# Patient Record
Sex: Female | Born: 2000
Health system: Southern US, Community
[De-identification: ages and names within clinical notes are randomized; demographics above are authoritative.]

## PROBLEM LIST (undated history)

## (undated) DIAGNOSIS — N920 Excessive and frequent menstruation with regular cycle: Secondary | ICD-10-CM

## (undated) DIAGNOSIS — N83209 Unspecified ovarian cyst, unspecified side: Secondary | ICD-10-CM

## (undated) HISTORY — PX: TONSILLECTOMY: SUR1361

## (undated) HISTORY — DX: Unspecified ovarian cyst, unspecified side: N83.209

## (undated) HISTORY — DX: Excessive and frequent menstruation with regular cycle: N92.0

---
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
## 2021-05-11 ENCOUNTER — Other Ambulatory Visit (INDEPENDENT_AMBULATORY_CARE_PROVIDER_SITE_OTHER): Payer: 59 | Admitting: Family Medicine

## 2021-05-11 ENCOUNTER — Other Ambulatory Visit: Payer: Self-pay

## 2021-05-11 ENCOUNTER — Encounter: Payer: Self-pay | Admitting: Family Medicine

## 2021-05-11 ENCOUNTER — Other Ambulatory Visit: Payer: 59

## 2021-05-11 ENCOUNTER — Telehealth (INDEPENDENT_AMBULATORY_CARE_PROVIDER_SITE_OTHER): Payer: 59 | Admitting: Family Medicine

## 2021-05-11 DIAGNOSIS — J069 Acute upper respiratory infection, unspecified: Secondary | ICD-10-CM | POA: Diagnosis not present

## 2021-05-11 DIAGNOSIS — J029 Acute pharyngitis, unspecified: Secondary | ICD-10-CM | POA: Diagnosis not present

## 2021-05-11 DIAGNOSIS — R0981 Nasal congestion: Secondary | ICD-10-CM

## 2021-05-11 LAB — POCT RAPID STREP A (OFFICE): Rapid Strep A Screen: NEGATIVE

## 2021-05-11 NOTE — Assessment & Plan Note (Signed)
With recent COVID exposure, anticipate COVID related symptoms. I have asked her to come in this afternoon for PCR swab. She does work at [employer]. With ST and h/o strep, check RST.  Supportive care measures reviewed including fluids, rest, mucinex, vit C, vit D, zinc, etc.  Reviewed isolation guidelines if COVID swab returns positive.  Update if not improving with treatment.

## 2021-05-11 NOTE — Telephone Encounter (Signed)
Called patient set up as virtual with you today.

## 2021-05-11 NOTE — Progress Notes (Signed)
Patient ID: Lindsey Schmidt, female    DOB: Feb 17, 2001, 20 y.o.   MRN: [id number]  Virtual visit completed through [employer], a video enabled telemedicine application. Due to national recommendations of social distancing due to COVID-19, a virtual visit is felt to be most appropriate for this patient at this time. Reviewed limitations, risks, security and privacy concerns of performing a virtual visit and the availability of in person appointments. I also reviewed that there may be a patient responsible charge related to this service. The patient agreed to proceed.   Patient location: home Provider location: [place] [facility], office Persons participating in this virtual visit: patient, provider   If any vitals were documented, they were collected by patient at home unless specified below.    Temp 98.8 F (37.1 C)   Ht '5\' 8"'$  (1.727 m)   Wt 192 lb 7 oz (87.3 kg)   LMP 03/30/2021 (Approximate) Comment: Recently started new birth conrtol  BMI 29.26 kg/m    CC: ST Subjective:   HPI: Lindsey Schmidt is a 20 y.o. female presenting on 05/11/2021 for Sore Throat (C/o ST, nasal congestion, HA and earache.  Sxs started 05/10/21.  Has had 2 Pfizer doses, last was 03/18/20.)   1d h/o ST, head congestion, sinus pressure, headache and L earache. Minimal cough. Some upper back soreness.   No fevers/chills, tooth pain, abd pain, nausea, diarrhea. No dyspnea or wheezing.   No h/o asthma.  No smoking.  H/o strep throat - but this has improved since tonsils removed.   She took 2 rapid COVID tests at home - both negative.  2 coworkers tested positive for [place] recently.   Treated with allegra.  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  Has had covid vaccine x2, booster x1.     Relevant past medical, surgical, family and social history reviewed and updated as indicated. Interim medical history since our last visit reviewed. Allergies and medications reviewed and updated. Outpatient Medications  Prior to Visit  Medication Sig Dispense Refill   etonogestrel-ethinyl estradiol (NUVARING) 0.12-0.015 MG/24HR vaginal ring SMARTSIG:1 Ring Vaginal Once a Month     No facility-administered medications prior to visit.     Per HPI unless specifically indicated in ROS section below Review of Systems Objective:  Temp 98.8 F (37.1 C)   Ht '5\' 8"'$  (1.727 m)   Wt 192 lb 7 oz (87.3 kg)   LMP 03/30/2021 (Approximate) Comment: Recently started new birth conrtol  BMI 29.26 kg/m   Wt Readings from Last 3 Encounters:  05/11/21 192 lb 7 oz (87.3 kg) (97 %, Z= 1.83)*  07/31/19 177 lb 8 oz (80.5 kg) (95 %, Z= 1.66)*  11/17/18 158 lb 8 oz (71.9 kg) (90 %, Z= 1.30)*   * Growth percentiles are based on CDC (Girls, 2-20 Years) data.       Physical exam: Gen: alert, NAD, not ill appearing Pulm: speaks in complete sentences without increased work of breathing Psych: normal mood, normal thought content      Assessment & Plan:   Problem List Items Addressed This Visit     Acute upper respiratory infection    With recent COVID exposure, anticipate COVID related symptoms. I have asked her to come in this afternoon for PCR swab. She does work at [employer]. With ST and h/o strep, check RST.  Supportive care measures reviewed including fluids, rest, mucinex, vit C, vit D, zinc, etc.  Reviewed isolation guidelines if COVID swab returns positive.  Update if not improving with treatment.         No orders of the defined types were placed in this encounter.  No orders of the defined types were placed in this encounter.   I discussed the assessment and treatment plan with the patient. The patient was provided an opportunity to ask questions and all were answered. The patient agreed with the plan and demonstrated an understanding of the instructions. The patient was advised to call back or seek an in-person evaluation if the symptoms worsen or if the condition fails to improve as anticipated.  Follow up  plan: No follow-ups on file.  Ria Bush, MD

## 2021-05-12 LAB — SARS-COV-2, NAA 2 DAY TAT

## 2021-05-12 LAB — NOVEL CORONAVIRUS, NAA: SARS-CoV-2, NAA: NOT DETECTED

## 2021-05-13 ENCOUNTER — Encounter: Payer: Self-pay | Admitting: Family Medicine

## 2022-01-08 ENCOUNTER — Other Ambulatory Visit: Payer: Self-pay

## 2022-01-08 ENCOUNTER — Ambulatory Visit (INDEPENDENT_AMBULATORY_CARE_PROVIDER_SITE_OTHER): Payer: 59 | Admitting: Primary Care

## 2022-01-08 ENCOUNTER — Encounter: Payer: Self-pay | Admitting: Primary Care

## 2022-01-08 VITALS — BP 118/78 | HR 110 | Temp 97.9°F | Ht 68.0 in | Wt 199.6 lb

## 2022-01-08 DIAGNOSIS — J302 Other seasonal allergic rhinitis: Secondary | ICD-10-CM

## 2022-01-08 DIAGNOSIS — Z23 Encounter for immunization: Secondary | ICD-10-CM | POA: Diagnosis not present

## 2022-01-08 DIAGNOSIS — Z0000 Encounter for general adult medical examination without abnormal findings: Secondary | ICD-10-CM

## 2022-01-08 DIAGNOSIS — N920 Excessive and frequent menstruation with regular cycle: Secondary | ICD-10-CM

## 2022-01-08 DIAGNOSIS — Z0184 Encounter for antibody response examination: Secondary | ICD-10-CM

## 2022-01-08 DIAGNOSIS — Z111 Encounter for screening for respiratory tuberculosis: Secondary | ICD-10-CM

## 2022-01-08 NOTE — Addendum Note (Signed)
Addended by: Tyrone Apple on: 01/08/2022 04:01 PM ? ? Modules accepted: Orders ? ?

## 2022-01-08 NOTE — Assessment & Plan Note (Signed)
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 ?Other vaccines up-to-date. ? ?Discussed the importance of a healthy diet and regular exercise in order for weight loss, and to reduce the risk of further co-morbidity. ? ?Exam today stable. ?Labs pending. ?

## 2022-01-08 NOTE — Assessment & Plan Note (Signed)
Stable. ? ?Continue to monitor.  ?Not on treatment.  ?

## 2022-01-08 NOTE — Assessment & Plan Note (Signed)
Controlled. ? ?Follows with GYN. ?Continue Nuva Ring. ?

## 2022-01-08 NOTE — Patient Instructions (Signed)
Stop by the lab prior to leaving today. I will notify you of your results once received.  ? ?You may pick up your forms on Monday morning.  ? ?It was a pleasure to see you today! ? ?Preventive Care 4-20 Years Old, Female ?Preventive care refers to lifestyle choices and visits with your health care provider that can promote health and wellness. Preventive care visits are also called wellness exams. ?What can I expect for my preventive care visit? ?Counseling ?During your preventive care visit, your health care provider may ask about your: ?Medical history, including: ?Past medical problems. ?Family medical history. ?Pregnancy history. ?Current health, including: ?Menstrual cycle. ?Method of birth control. ?Emotional well-being. ?Home life and relationship well-being. ?Sexual activity and sexual health. ?Lifestyle, including: ?Alcohol, nicotine or tobacco, and drug use. ?Access to firearms. ?Diet, exercise, and sleep habits. ?Work and work Statistician. ?Sunscreen use. ?Safety issues such as seatbelt and bike helmet use. ?Physical exam ?Your health care provider may check your: ?Height and weight. These may be used to calculate your BMI (body mass index). BMI is a measurement that tells if you are at a healthy weight. ?Waist circumference. This measures the distance around your waistline. This measurement also tells if you are at a healthy weight and may help predict your risk of certain diseases, such as type 2 diabetes and high blood pressure. ?Heart rate and blood pressure. ?Body temperature. ?Skin for abnormal spots. ?What immunizations do I need? ?Vaccines are usually given at various ages, according to a schedule. Your health care provider will recommend vaccines for you based on your age, medical history, and lifestyle or other factors, such as travel or where you work. ?What tests do I need? ?Screening ?Your health care provider may recommend screening tests for certain conditions. This may include: ?Pelvic  exam and Pap test. ?Lipid and cholesterol levels. ?Diabetes screening. This is done by checking your blood sugar (glucose) after you have not eaten for a while (fasting). ?Hepatitis B test. ?Hepatitis C test. ?HIV (human immunodeficiency virus) test. ?STI (sexually transmitted infection) testing, if you are at risk. ?BRCA-related cancer screening. This may be done if you have a family history of breast, ovarian, tubal, or peritoneal cancers. ?Talk with your health care provider about your test results, treatment options, and if necessary, the need for more tests. ?Follow these instructions at home: ?Eating and drinking ? ?Eat a healthy diet that includes fresh fruits and vegetables, whole grains, lean protein, and low-fat dairy products. ?Take vitamin and mineral supplements as recommended by your health care provider. ?Do not drink alcohol if: ?Your health care provider tells you not to drink. ?You are pregnant, may be pregnant, or are planning to become pregnant. ?If you drink alcohol: ?Limit how much you have to 0-1 drink a day. ?Know how much alcohol is in your drink. In the [place] one drink equals one 12 oz bottle of beer (355 mL), one 5 oz glass of wine (148 mL), or one 1? oz glass of hard liquor (44 mL). ?Lifestyle ?Brush your teeth every morning and night with fluoride toothpaste. Floss one time each day. ?Exercise for at least 30 minutes 5 or more days each week. ?Do not use any products that contain nicotine or tobacco. These products include cigarettes, chewing tobacco, and vaping devices, such as e-cigarettes. If you need help quitting, ask your health care provider. ?Do not use drugs. ?If you are sexually active, practice safe sex. Use a condom or other form of protection to prevent STIs. ?  If you do not wish to become pregnant, use a form of birth control. If you plan to become pregnant, see your health care provider for a prepregnancy visit. ?Find healthy ways to manage stress, such as: ?Meditation,  yoga, or listening to music. ?Journaling. ?Talking to a trusted person. ?Spending time with friends and family. ?Minimize exposure to UV radiation to reduce your risk of skin cancer. ?Safety ?Always wear your seat belt while driving or riding in a vehicle. ?Do not drive: ?If you have been drinking alcohol. Do not ride with someone who has been drinking. ?If you have been using any mind-altering substances or drugs. ?While texting. ?When you are tired or distracted. ?Wear a helmet and other protective equipment during sports activities. ?If you have firearms in your house, make sure you follow all gun safety procedures. ?Seek help if you have been physically or sexually abused. ?What's next? ?Go to your health care provider once a year for an annual wellness visit. ?Ask your health care provider how often you should have your eyes and teeth checked. ?Stay up to date on all vaccines. ?This information is not intended to replace advice given to you by your health care provider. Make sure you discuss any questions you have with your health care provider. ?Document Revised: 04/01/2021 Document Reviewed: 04/01/2021 ?Elsevier Patient Education ? [place]. ? ?

## 2022-01-08 NOTE — Progress Notes (Signed)
? ?Subjective:  ? ? Patient ID: Lindsey Schmidt, female    DOB: 2001/04/13, 21 y.o.   MRN: [id number] ? ?HPI ? ?Lindsey Schmidt is a very pleasant 21 y.o. female who presents today for complete physical and follow up of chronic conditions. ? ?Immunizations: ?-Tetanus: 2014, needs for school.  ?-Influenza: Completed this season  ?-Covid-19: 3 vaccines ? ?-HPV: Completed series ? ?Diet: Fair diet.  ?Exercise: Exercising at the gym twice weekly  ? ?Eye exam: Completed years ago ?Dental exam: Completes semi-annually  ? ?BP Readings from Last 3 Encounters:  ?01/08/22 118/78  ?10/24/20 128/82  ?07/31/19 122/70 (84 %, Z = 0.99 /  66 %, Z = 0.41)*  ? ?*BP percentiles are based on the 2017 AAP Clinical Practice Guideline for girls  ? ? ? ? ? ? ?Review of Systems  ?Constitutional:  Negative for unexpected weight change.  ?HENT:  Negative for rhinorrhea.   ?Eyes:  Negative for visual disturbance.  ?Respiratory:  Negative for cough and shortness of breath.   ?Cardiovascular:  Negative for chest pain.  ?Gastrointestinal:  Negative for constipation and diarrhea.  ?Genitourinary:  Negative for difficulty urinating and menstrual problem.  ?Musculoskeletal:  Negative for arthralgias and myalgias.  ?Skin:  Negative for rash.  ?Allergic/Immunologic: Negative for environmental allergies.  ?Neurological:  Negative for dizziness and headaches.  ?Psychiatric/Behavioral:  The patient is not nervous/anxious.   ? ?   ? ? ?Past Medical History:  ?Diagnosis Date  ? Menorrhagia   ? Ovarian cyst   ? x2  ? ? ?Social History  ? ?Socioeconomic History  ? Marital status: Single  ?  Spouse name: Not on file  ? Number of children: Not on file  ? Years of education: Not on file  ? Highest education level: Not on file  ?Occupational History  ? Not on file  ?Tobacco Use  ? Smoking status: Never  ? Smokeless tobacco: Never  ?Substance and Sexual Activity  ? Alcohol use: No  ? Drug use: Not on file  ? Sexual activity: Not on file  ?Other Topics Concern  ?  Not on file  ?Social History Narrative  ? 9th grade.  ? Enjoys History.  ? 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  ? Enjoys playing golf, softball, plays in the marching band.  ? ?Social Determinants of Health  ? ?Financial Resource Strain: Not on file  ?Food Insecurity: Not on file  ?Transportation Needs: Not on file  ?Physical Activity: Not on file  ?Stress: Not on file  ?Social Connections: Not on file  ?Intimate Partner Violence: Not on file  ? ? ?Past Surgical History:  ?Procedure Laterality Date  ? TONSILLECTOMY    ? ? ?No family history on file. ? ?No Known Allergies ? ?Current Outpatient Medications on File Prior to Visit  ?Medication Sig Dispense Refill  ? etonogestrel-ethinyl estradiol (NUVARING) 0.12-0.015 MG/24HR vaginal ring SMARTSIG:1 Ring Vaginal Once a Month    ? ?No current facility-administered medications on file prior to visit.  ? ? ?BP 118/78   Pulse (!) 110   Ht '5\' 8"'$  (1.727 m)   Wt 199 lb 9.6 oz (90.5 kg)   SpO2 96%   BMI 30.35 kg/m?  ?Objective:  ? Physical Exam ?HENT:  ?   Right Ear: Tympanic membrane and ear canal normal.  ?   Left Ear: Tympanic membrane and ear canal normal.  ?   Nose: Nose normal.  ?Eyes:  ?   Conjunctiva/sclera: Conjunctivae normal.  ?  Pupils: Pupils are equal, round, and reactive to light.  ?Neck:  ?   Thyroid: No thyromegaly.  ?Cardiovascular:  ?   Rate and Rhythm: Normal rate and regular rhythm.  ?   Heart sounds: No murmur heard. ?Pulmonary:  ?   Effort: Pulmonary effort is normal.  ?   Breath sounds: Normal breath sounds. No rales.  ?Abdominal:  ?   General: Bowel sounds are normal.  ?   Palpations: Abdomen is soft.  ?   Tenderness: There is no abdominal tenderness.  ?Musculoskeletal:     ?   General: Normal range of motion.  ?   Cervical back: Neck supple.  ?Lymphadenopathy:  ?   Cervical: No cervical adenopathy.  ?Skin: ?   General: Skin is warm and dry.  ?   Findings: No rash.  ?Neurological:  ?   Mental Status: She is alert and oriented to person, place, and time.  ?    Cranial Nerves: No cranial nerve deficit.  ?   Deep Tendon Reflexes: Reflexes are normal and symmetric.  ?Psychiatric:     ?   Mood and Affect: Mood normal.  ? ? ? ? ? ?   ?Assessment & Plan:  ? ? ? ? ?This visit occurred during the SARS-CoV-2 public health emergency.  Safety protocols were in place, including screening questions prior to the visit, additional usage of staff PPE, and extensive cleaning of exam room while observing appropriate contact time as indicated for disinfecting solutions.  ?

## 2022-01-11 LAB — COMPREHENSIVE METABOLIC PANEL
AG Ratio: 1.6 (calc) (ref 1.0–2.5)
ALT: 12 U/L (ref 6–29)
AST: 12 U/L (ref 10–30)
Albumin: 4.3 g/dL (ref 3.6–5.1)
Alkaline phosphatase (APISO): 47 U/L (ref 31–125)
BUN: 16 mg/dL (ref 7–25)
CO2: 21 mmol/L (ref 20–32)
Calcium: 9.6 mg/dL (ref 8.6–10.2)
Chloride: 106 mmol/L (ref 98–110)
Creat: 0.83 mg/dL (ref 0.50–0.96)
Globulin: 2.7 g/dL (calc) (ref 1.9–3.7)
Glucose, Bld: 89 mg/dL (ref 65–99)
Potassium: 4.5 mmol/L (ref 3.5–5.3)
Sodium: 139 mmol/L (ref 135–146)
Total Bilirubin: 0.3 mg/dL (ref 0.2–1.2)
Total Protein: 7 g/dL (ref 6.1–8.1)

## 2022-01-11 LAB — CBC
HCT: 45.6 % — ABNORMAL HIGH (ref 35.0–45.0)
Hemoglobin: 15.2 g/dL (ref 11.7–15.5)
MCH: 30.2 pg (ref 27.0–33.0)
MCHC: 33.3 g/dL (ref 32.0–36.0)
MCV: 90.5 fL (ref 80.0–100.0)
MPV: 11.8 fL (ref 7.5–12.5)
Platelets: 257 10*3/uL (ref 140–400)
RBC: 5.04 10*6/uL (ref 3.80–5.10)
RDW: 12 % (ref 11.0–15.0)
WBC: 6.5 10*3/uL (ref 3.8–10.8)

## 2022-01-11 LAB — HEPATITIS B SURFACE ANTIBODY, QUANTITATIVE: Hepatitis B-Post: <5 m[IU]/mL — ABNORMAL LOW (ref >=10)

## 2022-01-11 LAB — LIPID PANEL
Cholesterol: 154 mg/dL (ref <200)
HDL: 65 mg/dL (ref >=50)
LDL Cholesterol (Calc): 71 mg/dL (calc)
Non-HDL Cholesterol (Calc): 89 mg/dL (calc) (ref <130)
Total CHOL/HDL Ratio: 2.4 (calc) (ref <5.0)
Triglycerides: 96 mg/dL (ref <150)

## 2022-01-13 LAB — QUANTIFERON-TB GOLD PLUS
Mitogen-NIL: 8.21 IU/mL
NIL: 2.99 IU/mL
QuantiFERON-TB Gold Plus: POSITIVE — AB
TB1-NIL: <0 IU/mL
TB2-NIL: 1.29 IU/mL

## 2022-01-14 DIAGNOSIS — R7612 Nonspecific reaction to cell mediated immunity measurement of gamma interferon antigen response without active tuberculosis: Secondary | ICD-10-CM

## 2022-01-17 ENCOUNTER — Other Ambulatory Visit: Payer: Self-pay | Admitting: Primary Care

## 2022-07-29 NOTE — Telephone Encounter (Signed)
Patient called in and scheduled with Tabitha tomorrow.

## 2022-07-29 NOTE — Telephone Encounter (Signed)
Called patient to make appointment she can only come Friday after 2 this week or on Monday early. We did not have anything with you other than the blocked at the end of the day. She wanted to know if we could do virtual. Advised that we would need to see in office. Told her that I would check with Anda Kraft and see what you had in mind. Didn't know if you wanted to work her in. I didn't let patient know that was option.

## 2022-07-30 ENCOUNTER — Ambulatory Visit: Payer: 59 | Admitting: Family

## 2022-07-30 VITALS — BP 130/78 | HR 94 | Temp 98.6°F | Resp 16 | Ht 68.0 in | Wt 188.5 lb

## 2022-07-30 DIAGNOSIS — G43009 Migraine without aura, not intractable, without status migrainosus: Secondary | ICD-10-CM | POA: Insufficient documentation

## 2022-07-30 DIAGNOSIS — L659 Nonscarring hair loss, unspecified: Secondary | ICD-10-CM | POA: Diagnosis not present

## 2022-07-30 DIAGNOSIS — E538 Deficiency of other specified B group vitamins: Secondary | ICD-10-CM | POA: Diagnosis not present

## 2022-07-30 DIAGNOSIS — R110 Nausea: Secondary | ICD-10-CM | POA: Diagnosis not present

## 2022-07-30 MED ORDER — RIZATRIPTAN BENZOATE 5 MG PO TABS: 5.0000 mg | ORAL_TABLET | ORAL | 0 refills | Status: DC | PRN

## 2022-07-30 MED ORDER — ONDANSETRON HCL 4 MG PO TABS: 4.0000 mg | ORAL_TABLET | Freq: Three times a day (TID) | ORAL | 0 refills | Status: AC | PRN

## 2022-07-30 NOTE — Patient Instructions (Signed)
Start trial maxalt.  Increase water intake.    Regards,   Eugenia Pancoast FNP-C

## 2022-07-30 NOTE — Assessment & Plan Note (Signed)
Trial maxalt 5 mg sent to pharmacy  zofran 4 mg sent as well prn nausea Increase water intake, goal 64 oz daily   May consider sinus relation as well Advised to start flonase and claritin daily  May consider prednisone if no improvement

## 2022-07-30 NOTE — Progress Notes (Unsigned)
Established Patient Office Visit  Subjective:  Patient ID: Lindsey Schmidt, female    DOB: October 03, 2001  Age: 21 y.o. MRN: [id number]  CC:  Chief Complaint  Patient presents with   Headache    X weeks worse yesterday and today    Nausea    No vomiting     HPI Lindsey Schmidt is here today with concerns.   Over the last few weeks on and off with migraine, worse in the last two days. Light sensitive, nauseous, not throwing up, with headache. No blurry or change in vision, no numbness tingling extremities, headache starts as a headband and works itself around the band. Inside of ears she feels it as well. Has taken some tylenol, and tried ibuprofen without much relief. Does have some neck tightness that has receded over the last few days.   No sinus pressure no nasal congestion. No sore throat.   Has not had migraines since childhood, she did see a neurologist in the past as well for headaches. Her mom and sister both suffer from migraines.    Past Medical History:  Diagnosis Date   Menorrhagia    Ovarian cyst    x2    Past Surgical History:  Procedure Laterality Date   TONSILLECTOMY      No family history on file.  Social History   Socioeconomic History   Marital status: Single    Spouse name: Not on file   Number of children: Not on file   Years of education: Not on file   Highest education level: Not on file  Occupational History   Not on file  Tobacco Use   Smoking status: Never   Smokeless tobacco: Never  Substance and Sexual Activity   Alcohol use: No   Drug use: Not on file   Sexual activity: Not on file  Other Topics Concern   Not on file  Social History Narrative   9th grade.   Enjoys History.   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   Enjoys playing golf, softball, plays in the marching band.   Social Determinants of Health   Financial Resource Strain: Not on file  Food Insecurity: Not on file  Transportation Needs: Not on file  Physical Activity: Not on  file  Stress: Not on file  Social Connections: Not on file  Intimate Partner Violence: Not on file    Outpatient Medications Prior to Visit  Medication Sig Dispense Refill   etonogestrel-ethinyl estradiol (NUVARING) 0.12-0.015 MG/24HR vaginal ring SMARTSIG:1 Ring Vaginal Once a Month     No facility-administered medications prior to visit.    No Known Allergies      Objective:    Physical Exam Constitutional:      General: She is not in acute distress.    Appearance: She is well-developed. She is obese. She is not ill-appearing, toxic-appearing or diaphoretic.  HENT:     Nose: Nasal tenderness present.     Right Turbinates: Enlarged and swollen.     Left Turbinates: Enlarged and swollen.     Right Sinus: Maxillary sinus tenderness and frontal sinus tenderness present.     Left Sinus: Maxillary sinus tenderness and frontal sinus tenderness present.     Mouth/Throat:     Pharynx: Posterior oropharyngeal erythema present.  Eyes:     Comments: Light sensitive, wearing sunglasses  Musculoskeletal:     Cervical back: Muscular tenderness (posteriorc neck left greater than right) present.  Neurological:     Mental Status: She  is alert and oriented to person, place, and time.     Cranial Nerves: Cranial nerves 2-12 are intact. No cranial nerve deficit.     Sensory: Sensation is intact.     Motor: Motor function is intact.     Coordination: Coordination is intact.     Gait: Gait is intact.     BP 130/78   Pulse 94   Temp 98.6 F (37 C)   Resp 16   Ht '5\' 8"'$  (1.727 m)   Wt 188 lb 8 oz (85.5 kg)   LMP 07/23/2022   SpO2 99%   BMI 28.66 kg/m  Wt Readings from Last 3 Encounters:  07/30/22 188 lb 8 oz (85.5 kg)  01/08/22 199 lb 9.6 oz (90.5 kg)  05/11/21 192 lb 7 oz (87.3 kg) (97 %, Z= 1.83)*   * Growth percentiles are based on CDC (Girls, 2-20 Years) data.     Health Maintenance Due  Topic Date Due   HIV Screening  Never done   Hepatitis C Screening  Never done    INFLUENZA VACCINE  05/18/2022    There are no preventive care reminders to display for this patient.  Lab Results  Component Value Date   TSH 1.23 10/24/2020   Lab Results  Component Value Date   WBC 6.5 01/08/2022   HGB 15.2 01/08/2022   HCT 45.6 (H) 01/08/2022   MCV 90.5 01/08/2022   PLT 257 01/08/2022   Lab Results  Component Value Date   NA 139 01/08/2022   K 4.5 01/08/2022   CO2 21 01/08/2022   GLUCOSE 89 01/08/2022   BUN 16 01/08/2022   CREATININE 0.83 01/08/2022   BILITOT 0.3 01/08/2022   ALKPHOS 55 10/24/2020   AST 12 01/08/2022   ALT 12 01/08/2022   PROT 7.0 01/08/2022   ALBUMIN 4.6 10/24/2020   CALCIUM 9.6 01/08/2022   GFR 107.24 10/24/2020   No results found for: "HGBA1C"    Assessment & Plan:   Problem List Items Addressed This Visit       Cardiovascular and Mediastinum   Migraine without aura and without status migrainosus, not intractable    Trial maxalt 5 mg sent to pharmacy  zofran 4 mg sent as well prn nausea Increase water intake, goal 64 oz daily   May consider sinus relation as well Advised to start flonase and claritin daily  May consider prednisone if no improvement      Relevant Medications   rizatriptan (MAXALT) 5 MG tablet   Other Relevant Orders   Basic metabolic panel   CBC   Sedimentation rate   TSH     Other   Hair thinning   Relevant Orders   TSH   Low serum vitamin B12 - Primary   Relevant Orders   Vitamin B12   Other Visit Diagnoses     Nausea       Relevant Medications   ondansetron (ZOFRAN) 4 MG tablet       Meds ordered this encounter  Medications   rizatriptan (MAXALT) 5 MG tablet    Sig: Take 1 tablet (5 mg total) by mouth as needed for migraine. May repeat in 2 hours if needed    Dispense:  10 tablet    Refill:  0    Order Specific Question:   Supervising Provider    Answer:   BEDSOLE, AMY E [[id number]]   ondansetron (ZOFRAN) 4 MG tablet    Sig: Take 1 tablet (4 mg total) by mouth  every 8 (eight)  hours as needed for nausea or vomiting.    Dispense:  20 tablet    Refill:  0    Order Specific Question:   Supervising Provider    Answer:   Diona Browner, AMY E [[id number]    Follow-up: Return for f/u with primary care provider if no improvement.    Eugenia Pancoast, FNP

## 2022-07-31 LAB — CBC
HCT: 48.5 % — ABNORMAL HIGH (ref 35.0–45.0)
Hemoglobin: 16.6 g/dL — ABNORMAL HIGH (ref 11.7–15.5)
MCH: 30.7 pg (ref 27.0–33.0)
MCHC: 34.2 g/dL (ref 32.0–36.0)
MCV: 89.6 fL (ref 80.0–100.0)
MPV: 10.8 fL (ref 7.5–12.5)
Platelets: 303 10*3/uL (ref 140–400)
RBC: 5.41 10*6/uL — ABNORMAL HIGH (ref 3.80–5.10)
RDW: 11.7 % (ref 11.0–15.0)
WBC: 5.5 10*3/uL (ref 3.8–10.8)

## 2022-07-31 LAB — BASIC METABOLIC PANEL
BUN: 10 mg/dL (ref 7–25)
CO2: 23 mmol/L (ref 20–32)
Calcium: 9.8 mg/dL (ref 8.6–10.2)
Chloride: 103 mmol/L (ref 98–110)
Creat: 0.76 mg/dL (ref 0.50–0.96)
Glucose, Bld: 89 mg/dL (ref 65–99)
Potassium: 4.1 mmol/L (ref 3.5–5.3)
Sodium: 139 mmol/L (ref 135–146)

## 2022-07-31 LAB — SEDIMENTATION RATE: Sed Rate: 6 mm/h (ref 0–20)

## 2022-07-31 LAB — TSH: TSH: 0.67 mIU/L

## 2022-07-31 LAB — VITAMIN B12: Vitamin B-12: 382 pg/mL (ref 200–1100)

## 2022-08-02 NOTE — Progress Notes (Signed)
Thyroid level ok and normal however trending on the lower than normal side. When you f/u with PCP make note to repeat this.   Two markers, hemoglobin hematocrit slight elevation. Could be inflammatory. Make sure you start taking b12 1000 mcg daily if you have not already as this may help. When you are in town next I do suggest making follow up with Anda Kraft to repeat these two numbers as well to make sure not still elevated.   How is her headache has it improved?

## 2022-08-03 ENCOUNTER — Telehealth: Payer: Self-pay | Admitting: Primary Care

## 2022-08-03 DIAGNOSIS — R7989 Other specified abnormal findings of blood chemistry: Secondary | ICD-10-CM

## 2022-08-03 NOTE — Telephone Encounter (Signed)
Patient returned phone call pertaining her lab results. Would like a call back before 12pm,she goes to work at that time.

## 2022-08-03 NOTE — Telephone Encounter (Signed)
Called and spoke with patients mother. Advised of results. Patient sent a MyChart message to Anda Kraft this morning, awaiting response.

## 2022-08-06 LAB — T3, FREE: T3, Free: 4 pg/mL (ref 2.0–4.4)

## 2022-08-06 LAB — T4, FREE: Free T4: 1.58 ng/dL (ref 0.82–1.77)

## 2022-08-06 LAB — TSH: TSH: 1.04 u[IU]/mL (ref 0.450–4.500)

## 2022-09-08 ENCOUNTER — Ambulatory Visit: Payer: 59 | Admitting: Primary Care

## 2022-10-06 ENCOUNTER — Encounter: Payer: Self-pay | Admitting: Primary Care

## 2022-10-06 ENCOUNTER — Ambulatory Visit (INDEPENDENT_AMBULATORY_CARE_PROVIDER_SITE_OTHER): Payer: 59 | Admitting: Primary Care

## 2022-10-06 VITALS — BP 138/84 | HR 96 | Temp 98.8°F | Ht 69.0 in | Wt 186.0 lb

## 2022-10-06 DIAGNOSIS — G43009 Migraine without aura, not intractable, without status migrainosus: Secondary | ICD-10-CM | POA: Diagnosis not present

## 2022-10-06 DIAGNOSIS — R519 Headache, unspecified: Secondary | ICD-10-CM

## 2022-10-06 DIAGNOSIS — F411 Generalized anxiety disorder: Secondary | ICD-10-CM

## 2022-10-06 MED ORDER — PROPRANOLOL HCL ER 80 MG PO CP24: 80.0000 mg | ORAL_CAPSULE | Freq: Every day | ORAL | 0 refills | Status: DC

## 2022-10-06 MED ORDER — RIZATRIPTAN BENZOATE 5 MG PO TABS: ORAL_TABLET | ORAL | 0 refills | Status: DC

## 2022-10-06 NOTE — Assessment & Plan Note (Signed)
Discussed options for treatment, will start with propranolol ER 80 mg HS as we are also working to treat headaches. Follow up in 1 month virtually.

## 2022-10-06 NOTE — Progress Notes (Signed)
Subjective:    Patient ID: Lindsey Schmidt, female    DOB: 07-30-2001, 21 y.o.   MRN: [id number]  HPI  Lindsey Schmidt is a very pleasant 21 y.o. female with a history of migraines, menorrhagia who presents today to discuss migraines and anxiety.  1) Migraines/Headaches: Chronic history of migraines, overall infrequent. Last office visit was with Cassandria Santee, NP in October 2023 for a few weeks intermittent headache with migraine.  During this visit she was prescribed Maxalt 5 mg to use as needed and Zofran 4 mg to use as needed.  Since that visit she continues to experience intermittent tension type headaches for which she will notice when she wakes. Headaches are located to the parietal lobes, temporal lobes, behind her eyes mostly. Over the last one month she's experienced tension type headaches several times weekly.  The weather changes will induce headaches.  She takes Tylenol which somewhat helps. The Maxalt will abort the headaches, but she's used up 8 pills since October 2023. She does grind her teeth and has ordered a night guard. Also with family history of headaches in her mother and sister.   2) Anxiety: Chronic history of anxious feelings that date back to her middle school days. No prior treatment. About one year ago her anxiety significantly increased in college. She saw a therapist for a few months which helped. Since September 2023 she's noticed symptoms of feeling sweaty, heart palpitations, dull chest pain, feeling overwhelmed, worrying, over thinks everything. She has not seen a therapist since last year. She is interested in treatment.   Review of Systems  Cardiovascular:  Positive for palpitations.  Neurological:  Positive for headaches.  Psychiatric/Behavioral:  The patient is nervous/anxious.          Past Medical History:  Diagnosis Date   Menorrhagia    Ovarian cyst    x2    Social History   Socioeconomic History   Marital status: Single    Spouse name: Not  on file   Number of children: Not on file   Years of education: Not on file   Highest education level: Not on file  Occupational History   Not on file  Tobacco Use   Smoking status: Never   Smokeless tobacco: Never  Substance and Sexual Activity   Alcohol use: No   Drug use: Not on file   Sexual activity: Not on file  Other Topics Concern   Not on file  Social History Narrative   9th grade.   Enjoys History.   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   Enjoys playing golf, softball, plays in the marching band.   Social Determinants of Health   Financial Resource Strain: Not on file  Food Insecurity: Not on file  Transportation Needs: Not on file  Physical Activity: Not on file  Stress: Not on file  Social Connections: Not on file  Intimate Partner Violence: Not on file    Past Surgical History:  Procedure Laterality Date   TONSILLECTOMY      History reviewed. No pertinent family history.  No Known Allergies  Current Outpatient Medications on File Prior to Visit  Medication Sig Dispense Refill   etonogestrel-ethinyl estradiol (NUVARING) 0.12-0.015 MG/24HR vaginal ring SMARTSIG:1 Ring Vaginal Once a Month     ondansetron (ZOFRAN) 4 MG tablet Take 1 tablet (4 mg total) by mouth every 8 (eight) hours as needed for nausea or vomiting. 20 tablet 0   No current facility-administered medications on file prior to visit.  BP 138/84   Pulse 96   Temp 98.8 F (37.1 C) (Temporal)   Ht '5\' 9"'$  (1.753 m)   Wt 186 lb (84.4 kg)   SpO2 99%   BMI 27.47 kg/m  Objective:   Physical Exam Cardiovascular:     Rate and Rhythm: Normal rate and regular rhythm.  Pulmonary:     Effort: Pulmonary effort is normal.     Breath sounds: Normal breath sounds.  Musculoskeletal:     Cervical back: Neck supple.  Skin:    General: Skin is warm and dry.  Psychiatric:        Mood and Affect: Mood normal.           Assessment & Plan:   Problem List Items Addressed This Visit        Cardiovascular and Mediastinum   Migraine without aura and without status migrainosus, not intractable    Increased.  Refill provided for Maxalt 5 mg PRN. Will start daily preventative treatment.       Relevant Medications   rizatriptan (MAXALT) 5 MG tablet   propranolol ER (INDERAL LA) 80 MG 24 hr capsule     Other   Frequent headaches - Primary    Uncontrolled.  Discussed options.  Start propranolol ER 80 mg HS which could help with some of her physical anxiety symptoms.  Refill provided for Maxalt 5 mg PRN.  Follow up in 1 month.      Relevant Medications   rizatriptan (MAXALT) 5 MG tablet   propranolol ER (INDERAL LA) 80 MG 24 hr capsule   GAD (generalized anxiety disorder)    Discussed options for treatment, will start with propranolol ER 80 mg HS as we are also working to treat headaches. Follow up in 1 month virtually.      Relevant Medications   propranolol ER (INDERAL LA) 80 MG 24 hr capsule       Pleas Koch, NP

## 2022-10-06 NOTE — Assessment & Plan Note (Signed)
Increased.  Refill provided for Maxalt 5 mg PRN. Will start daily preventative treatment.

## 2022-10-06 NOTE — Patient Instructions (Signed)
Start propranolol ER 80 mg once daily at bedtime for headache prevention and anxiety.  Schedule a follow up visit for 1 month.  It was a pleasure to see you today!

## 2022-10-06 NOTE — Assessment & Plan Note (Signed)
Uncontrolled.  Discussed options.  Start propranolol ER 80 mg HS which could help with some of her physical anxiety symptoms.  Refill provided for Maxalt 5 mg PRN.  Follow up in 1 month.

## 2022-11-11 ENCOUNTER — Telehealth (INDEPENDENT_AMBULATORY_CARE_PROVIDER_SITE_OTHER): Payer: 59 | Admitting: Primary Care

## 2022-11-11 ENCOUNTER — Encounter: Payer: Self-pay | Admitting: Primary Care

## 2022-11-11 VITALS — Ht 68.0 in | Wt 185.0 lb

## 2022-11-11 DIAGNOSIS — R519 Headache, unspecified: Secondary | ICD-10-CM

## 2022-11-11 DIAGNOSIS — G43009 Migraine without aura, not intractable, without status migrainosus: Secondary | ICD-10-CM | POA: Diagnosis not present

## 2022-11-11 NOTE — Progress Notes (Signed)
Patient ID: [name], female    DOB: July 22, 2001, 22 y.o.   MRN: [id number]  Virtual visit completed through [employer], a video enabled telemedicine application. Due to national recommendations of social distancing due to COVID-19, a virtual visit is felt to be most appropriate for this patient at this time. Reviewed limitations, risks, security and privacy concerns of performing a virtual visit and the availability of in person appointments. I also reviewed that there may be a patient responsible charge related to this service. The patient agreed to proceed.   Patient location: home Provider location: [place] at [facility], office Persons participating in this virtual visit: patient, provider   If any vitals were documented, they were collected by patient at home unless specified below.    Ht '5\' 8"'$  (1.727 m)   Wt 185 lb (83.9 kg)   BMI 28.13 kg/m    CC: Migraine Follow Up Subjective:   HPI: Lindsey Schmidt is a 22 y.o. female presenting on 11/11/2022 for Medical Management of Chronic Issues (Frequent headaches//In the past week her headaches have gotten better, she is having them once a week, but not as severe. )   She was last evaluated on 10/06/22 for general follow up when she mentioned continued tension type headaches that are present upon waking in the morning. She has a significant family history of migraines. Overall migraines were controlled on Maxalt 5 mg and Zofran PRN, but she required 8 pills of Maxalt from October to December.   Given this information we decided to initiate propranolol ER 80 mg daily for headache prevention. She is here for follow up today.  Since her last visit her headaches have improved as they are less severe. She continues to experience 3 headaches weekly on average but is able to take Tylenol and Ibuprofen which will "knock it out". She has noticed vivid dreams, waking up a few times during the night without difficulty falling back asleep.  She's also noticed her resting heart rate decrease from 100 to around 80. Her physical symptoms of anxiety have improved.   She did experience several migraines when returning back to school for the semester, attributes these to stress. These have improved.       Relevant past medical, surgical, family and social history reviewed and updated as indicated. Interim medical history since our last visit reviewed. Allergies and medications reviewed and updated. Outpatient Medications Prior to Visit  Medication Sig Dispense Refill   etonogestrel-ethinyl estradiol (NUVARING) 0.12-0.015 MG/24HR vaginal ring SMARTSIG:1 Ring Vaginal Once a Month     ondansetron (ZOFRAN) 4 MG tablet Take 1 tablet (4 mg total) by mouth every 8 (eight) hours as needed for nausea or vomiting. 20 tablet 0   propranolol ER (INDERAL LA) 80 MG 24 hr capsule Take 1 capsule (80 mg total) by mouth at bedtime. For headache prevention and anxiety 90 capsule 0   rizatriptan (MAXALT) 5 MG tablet Take 1 tablet by mouth at migraine onset. May repeat in 2 hours if needed 10 tablet 0   No facility-administered medications prior to visit.     Per HPI unless specifically indicated in ROS section below Review of Systems  Cardiovascular:  Negative for palpitations.  Neurological:  Positive for headaches.  Psychiatric/Behavioral:  Positive for sleep disturbance.    Objective:  Ht '5\' 8"'$  (1.727 m)   Wt 185 lb (83.9 kg)   BMI 28.13 kg/m   Wt Readings from Last 3 Encounters:  11/11/22 185 lb (83.9 kg)  10/06/22 186 lb (84.4 kg)  07/30/22 188 lb 8 oz (85.5 kg)       Physical exam: General: Alert and oriented x 3, no distress, does not appear sickly  Pulmonary: Speaks in complete sentences without increased work of breathing, no cough during visit.  Psychiatric: Normal mood, thought content, and behavior.     Results for orders placed or performed in visit on 08/03/22  T4, free  Result Value Ref Range   Free T4 1.58 0.82 -  1.77 ng/dL  TSH  Result Value Ref Range   TSH 1.040 0.450 - 4.500 uIU/mL  T3, Free  Result Value Ref Range   T3, Free 4.0 2.0 - 4.4 pg/mL   Assessment & Plan:   Problem List Items Addressed This Visit       Cardiovascular and Mediastinum   Migraine without aura and without status migrainosus, not intractable    Improving.  Continue propranolol ER 80 mg daily for now. Continue Maxalt 5 mg PRN and Zofran PRN.        Other   Frequent headaches - Primary    Somewhat improved, but still with several headaches weekly. I offered to change her regimen given her continued headaches and side effects from propranolol, she kindly declines for now but will update if anything changes.  Would recommend Topamax 50 mg HS. Continue propranolol ER 80 mg for now. Would not increase dose given side effects.  Continue Maxalt 5 mg PRN.        No orders of the defined types were placed in this encounter.  No orders of the defined types were placed in this encounter.   I discussed the assessment and treatment plan with the patient. The patient was provided an opportunity to ask questions and all were answered. The patient agreed with the plan and demonstrated an understanding of the instructions. The patient was advised to call back or seek an in-person evaluation if the symptoms worsen or if the condition fails to improve as anticipated.  Follow up plan:  Continue propranolol ER 80 mg daily for now.  Please update me as discussed as there are other options for headache prevention.  It was a pleasure to see you today!   Pleas Koch, NP

## 2022-11-11 NOTE — Assessment & Plan Note (Signed)
Somewhat improved, but still with several headaches weekly. I offered to change her regimen given her continued headaches and side effects from propranolol, she kindly declines for now but will update if anything changes.  Would recommend Topamax 50 mg HS. Continue propranolol ER 80 mg for now. Would not increase dose given side effects.  Continue Maxalt 5 mg PRN.

## 2022-11-11 NOTE — Patient Instructions (Signed)
Continue propranolol ER 80 mg daily for now.  Please update me as discussed as there are other options for headache prevention.  It was a pleasure to see you today!

## 2022-11-11 NOTE — Assessment & Plan Note (Signed)
Improving.  Continue propranolol ER 80 mg daily for now. Continue Maxalt 5 mg PRN and Zofran PRN.

## 2022-12-02 ENCOUNTER — Other Ambulatory Visit: Payer: Self-pay | Admitting: Primary Care

## 2022-12-02 DIAGNOSIS — F411 Generalized anxiety disorder: Secondary | ICD-10-CM

## 2022-12-02 DIAGNOSIS — R519 Headache, unspecified: Secondary | ICD-10-CM

## 2023-01-19 ENCOUNTER — Telehealth: Payer: 59 | Admitting: Primary Care

## 2023-01-19 ENCOUNTER — Encounter: Payer: Self-pay | Admitting: Primary Care

## 2023-01-19 VITALS — Ht 68.0 in | Wt 185.0 lb

## 2023-01-19 DIAGNOSIS — F411 Generalized anxiety disorder: Secondary | ICD-10-CM

## 2023-01-19 DIAGNOSIS — G43009 Migraine without aura, not intractable, without status migrainosus: Secondary | ICD-10-CM | POA: Diagnosis not present

## 2023-01-19 MED ORDER — SERTRALINE HCL 50 MG PO TABS: 50.0000 mg | ORAL_TABLET | Freq: Every day | ORAL | 0 refills | Status: DC

## 2023-01-19 NOTE — Patient Instructions (Signed)
Start sertraline (Zoloft) 50 mg for anxiety and depression. Take 1/2 tablet by mouth once daily for about one week, then increase to 1 full tablet thereafter.   Please schedule a follow up visit for 6 weeks for follow up of anxiety/depression.  It was a pleasure to see you today!

## 2023-01-19 NOTE — Progress Notes (Signed)
Patient ID: Lindsey Schmidt, female    DOB: 02-25-2001, 22 y.o.   MRN: [id number]  Virtual visit completed through [employer], a video enabled telemedicine application. Due to national recommendations of social distancing due to COVID-19, a virtual visit is felt to be most appropriate for this patient at this time. Reviewed limitations, risks, security and privacy concerns of performing a virtual visit and the availability of in person appointments. I also reviewed that there may be a patient responsible charge related to this service. The patient agreed to proceed.   Patient location: home Provider location: [place] at [facility], office Persons participating in this virtual visit: patient, provider   If any vitals were documented, they were collected by patient at home unless specified below.    Ht 5\' 8"  (1.727 m)   Wt 185 lb (83.9 kg)   LMP 01/10/2023 (Exact Date)   BMI 28.13 kg/m    CC: Several Concerns. Subjective:   HPI: Lindsey Schmidt is a 22 y.o. female with a history of migraines and GAD presenting on 01/19/2023 for Migraine Management  (She feels like she has bean having depressive episodes that could be related to her medication. ) and Anxiety (Patient states her anxiety is present but not worse/Recent depressive episodes)  Currently managed on propranolol ER 80 mg daily for migraine prevention and rizatriptan 5 mg PRN for migraine abortion.  History of anxiety and depression which has overall been controllable. Over the last few months she's noticed increased symptoms of feeling down, little motivation to do things, palpitations, irritability, and overall increased anxiety. She's been under increased stress with her school load. She's also recently ended a 4 year long relationship.   She began therapy today, had a good session, next session is scheduled for next week. She believes she may need some medication to help take the edge off. She has never taken anything for  anxiety.depression.        Relevant past medical, surgical, family and social history reviewed and updated as indicated. Interim medical history since our last visit reviewed. Allergies and medications reviewed and updated. Outpatient Medications Prior to Visit  Medication Sig Dispense Refill   etonogestrel-ethinyl estradiol (NUVARING) 0.12-0.015 MG/24HR vaginal ring SMARTSIG:1 Ring Vaginal Once a Month     ondansetron (ZOFRAN) 4 MG tablet Take 1 tablet (4 mg total) by mouth every 8 (eight) hours as needed for nausea or vomiting. 20 tablet 0   propranolol ER (INDERAL LA) 80 MG 24 hr capsule TAKE 1 CAPSULE (80 MG TOTAL) BY MOUTH AT BEDTIME. FOR HEADACHE PREVENTION AND ANXIETY 90 capsule 3   rizatriptan (MAXALT) 5 MG tablet Take 1 tablet by mouth at migraine onset. May repeat in 2 hours if needed 10 tablet 0   No facility-administered medications prior to visit.     Per HPI unless specifically indicated in ROS section below Review of Systems  Respiratory:  Negative for shortness of breath.   Cardiovascular:  Positive for palpitations.  Neurological:  Positive for headaches.  Psychiatric/Behavioral:  The patient is nervous/anxious.    Objective:  Ht 5\' 8"  (1.727 m)   Wt 185 lb (83.9 kg)   LMP 01/10/2023 (Exact Date)   BMI 28.13 kg/m   Wt Readings from Last 3 Encounters:  01/19/23 185 lb (83.9 kg)  11/11/22 185 lb (83.9 kg)  10/06/22 186 lb (84.4 kg)       Physical exam: General: Alert and oriented x 3, no distress, does not appear sickly  Pulmonary:  Speaks in complete sentences without increased work of breathing, no cough during visit.  Psychiatric: Normal mood, thought content, and behavior.     Results for orders placed or performed in visit on 08/03/22  T4, free  Result Value Ref Range   Free T4 1.58 0.82 - 1.77 ng/dL  TSH  Result Value Ref Range   TSH 1.040 0.450 - 4.500 uIU/mL  T3, Free  Result Value Ref Range   T3, Free 4.0 2.0 - 4.4 pg/mL   Assessment &  Plan:   Problem List Items Addressed This Visit       Cardiovascular and Mediastinum   Migraine without aura and without status migrainosus, not intractable    Deteriorated which is likely due to anxiety and increased stress.  Continue propranolol ER 80 mg daily for now. Will work to treat anxiety/depression.  Continue to monitor.       Relevant Medications   sertraline (ZOLOFT) 50 MG tablet     Other   GAD (generalized anxiety disorder) - Primary    Uncontrolled, also with symptoms of depression.  Continue with weekly therapy sessions.  Start Zoloft 50 mg.   Patient is to take 1/2 tablet daily for 8 days, then advance to 1 full tablet thereafter. We discussed possible side effects of headache, GI upset, drowsiness.  Follow up in 6 weeks for re-evaluation.        Relevant Medications   sertraline (ZOLOFT) 50 MG tablet     Meds ordered this encounter  Medications   sertraline (ZOLOFT) 50 MG tablet    Sig: Take 1 tablet (50 mg total) by mouth daily. for anxiety and depression.    Dispense:  90 tablet    Refill:  0    Order Specific Question:   Supervising Provider    Answer:   BEDSOLE, AMY E [[id number]]   No orders of the defined types were placed in this encounter.   I discussed the assessment and treatment plan with the patient. The patient was provided an opportunity to ask questions and all were answered. The patient agreed with the plan and demonstrated an understanding of the instructions. The patient was advised to call back or seek an in-person evaluation if the symptoms worsen or if the condition fails to improve as anticipated.  Follow up plan:  Start sertraline (Zoloft) 50 mg for anxiety and depression. Take 1/2 tablet by mouth once daily for about one week, then increase to 1 full tablet thereafter.   Please schedule a follow up visit for 6 weeks for follow up of anxiety/depression.  It was a pleasure to see you today!   Pleas Koch, NP

## 2023-01-19 NOTE — Assessment & Plan Note (Signed)
Uncontrolled, also with symptoms of depression.  Continue with weekly therapy sessions.  Start Zoloft 50 mg.   Patient is to take 1/2 tablet daily for 8 days, then advance to 1 full tablet thereafter. We discussed possible side effects of headache, GI upset, drowsiness.  Follow up in 6 weeks for re-evaluation.

## 2023-01-19 NOTE — Assessment & Plan Note (Signed)
Deteriorated which is likely due to anxiety and increased stress.  Continue propranolol ER 80 mg daily for now. Will work to treat anxiety/depression.  Continue to monitor.

## 2023-03-08 ENCOUNTER — Ambulatory Visit (INDEPENDENT_AMBULATORY_CARE_PROVIDER_SITE_OTHER): Payer: 59 | Admitting: Primary Care

## 2023-03-08 ENCOUNTER — Encounter: Payer: Self-pay | Admitting: Primary Care

## 2023-03-08 VITALS — BP 110/64 | HR 85 | Temp 98.4°F | Ht 68.0 in | Wt 172.0 lb

## 2023-03-08 DIAGNOSIS — J302 Other seasonal allergic rhinitis: Secondary | ICD-10-CM

## 2023-03-08 DIAGNOSIS — F411 Generalized anxiety disorder: Secondary | ICD-10-CM | POA: Diagnosis not present

## 2023-03-08 DIAGNOSIS — Z0000 Encounter for general adult medical examination without abnormal findings: Secondary | ICD-10-CM

## 2023-03-08 DIAGNOSIS — N920 Excessive and frequent menstruation with regular cycle: Secondary | ICD-10-CM | POA: Diagnosis not present

## 2023-03-08 DIAGNOSIS — G43009 Migraine without aura, not intractable, without status migrainosus: Secondary | ICD-10-CM

## 2023-03-08 DIAGNOSIS — R519 Headache, unspecified: Secondary | ICD-10-CM

## 2023-03-08 MED ORDER — RIZATRIPTAN BENZOATE 5 MG PO TABS: ORAL_TABLET | ORAL | 0 refills | Status: DC

## 2023-03-08 NOTE — Patient Instructions (Addendum)
Schedule an eye exam as discussed.  It was a pleasure to see you today!

## 2023-03-08 NOTE — Progress Notes (Signed)
Subjective:    Patient ID: Lindsey Schmidt, female    DOB: 03/06/2001, 22 y.o.   MRN: [id number]  HPI  Lindsey Schmidt is a very pleasant 22 y.o. female who presents today for complete physical and follow up of chronic conditions.  Immunizations: -Tetanus: Completed in 2023  Diet: Fair diet.  Exercise: Exercising 1-2 days weekly   Eye exam: Completed several years ago.  Dental exam: Completes semi-annually    Pap Smear: Never completed, has appointment with GYN.  BP Readings from Last 3 Encounters:  03/08/23 110/64  10/06/22 138/84  07/30/22 130/78         Review of Systems  Constitutional:  Negative for unexpected weight change.  HENT:  Negative for rhinorrhea.   Eyes:  Negative for visual disturbance.  Respiratory:  Negative for cough and shortness of breath.   Cardiovascular:  Negative for chest pain.  Gastrointestinal:  Negative for constipation and diarrhea.  Genitourinary:  Negative for difficulty urinating and menstrual problem.  Musculoskeletal:  Negative for arthralgias and myalgias.  Skin:  Negative for rash.  Allergic/Immunologic: Positive for environmental allergies.  Neurological:  Negative for dizziness and headaches.  Psychiatric/Behavioral:  The patient is not nervous/anxious.          Past Medical History:  Diagnosis Date   Menorrhagia    Ovarian cyst    x2    Social History   Socioeconomic History   Marital status: Single    Spouse name: Not on file   Number of children: Not on file   Years of education: Not on file   Highest education level: Not on file  Occupational History   Not on file  Tobacco Use   Smoking status: Never   Smokeless tobacco: Never  Substance and Sexual Activity   Alcohol use: No   Drug use: Not on file   Sexual activity: Not on file  Other Topics Concern   Not on file  Social History Narrative   9th grade.   Enjoys History.   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   Enjoys playing golf, softball, plays in the  marching band.   Social Determinants of Health   Financial Resource Strain: Not on file  Food Insecurity: Not on file  Transportation Needs: Not on file  Physical Activity: Not on file  Stress: Not on file  Social Connections: Not on file  Intimate Partner Violence: Not on file    Past Surgical History:  Procedure Laterality Date   TONSILLECTOMY      History reviewed. No pertinent family history.  No Known Allergies  Current Outpatient Medications on File Prior to Visit  Medication Sig Dispense Refill   etonogestrel-ethinyl estradiol (NUVARING) 0.12-0.015 MG/24HR vaginal ring SMARTSIG:1 Ring Vaginal Once a Month     propranolol ER (INDERAL LA) 80 MG 24 hr capsule TAKE 1 CAPSULE (80 MG TOTAL) BY MOUTH AT BEDTIME. FOR HEADACHE PREVENTION AND ANXIETY 90 capsule 3   sertraline (ZOLOFT) 50 MG tablet Take 1 tablet (50 mg total) by mouth daily. for anxiety and depression. 90 tablet 0   ondansetron (ZOFRAN) 4 MG tablet Take 1 tablet (4 mg total) by mouth every 8 (eight) hours as needed for nausea or vomiting. (Patient not taking: Reported on 03/08/2023) 20 tablet 0   No current facility-administered medications on file prior to visit.    BP 110/64   Pulse 85   Temp 98.4 F (36.9 C) (Temporal)   Ht 5\' 8"  (1.727 m)   Wt 172  lb (78 kg)   SpO2 98%   BMI 26.15 kg/m  Objective:   Physical Exam HENT:     Right Ear: Tympanic membrane and ear canal normal.     Left Ear: Tympanic membrane and ear canal normal.     Nose: Nose normal.  Eyes:     Conjunctiva/sclera: Conjunctivae normal.     Pupils: Pupils are equal, round, and reactive to light.  Neck:     Thyroid: No thyromegaly.  Cardiovascular:     Rate and Rhythm: Normal rate and regular rhythm.     Heart sounds: No murmur heard. Pulmonary:     Effort: Pulmonary effort is normal.     Breath sounds: Normal breath sounds. No rales.  Abdominal:     General: Bowel sounds are normal.     Palpations: Abdomen is soft.      Tenderness: There is no abdominal tenderness.  Musculoskeletal:        General: Normal range of motion.     Cervical back: Neck supple.  Lymphadenopathy:     Cervical: No cervical adenopathy.  Skin:    General: Skin is warm and dry.     Findings: No rash.  Neurological:     Mental Status: She is alert and oriented to person, place, and time.     Cranial Nerves: No cranial nerve deficit.     Deep Tendon Reflexes: Reflexes are normal and symmetric.  Psychiatric:        Mood and Affect: Mood normal.           Assessment & Plan:  Preventative health care Assessment & Plan: Immunizations UTD. Pap smear due, she will have done at GYN office. Discussed the importance of a healthy diet and regular exercise in order for weight loss, and to reduce the risk of further co-morbidity.  Exam stable.  Follow up in 1 year for repeat physical.    Migraine without aura and without status migrainosus, not intractable Assessment & Plan: Controlled.  Continue propranolol ER 80 mg daily. Continue Maxalt 5 mg PRN for which she uses sparingly.  Continue Zofran 4 mg PRN.  Orders: -     Rizatriptan Benzoate; Take 1 tablet by mouth at migraine onset. May repeat in 2 hours if needed  Dispense: 10 tablet; Refill: 0  Frequent headaches Assessment & Plan: Controlled.  Continue Propranolol ER 80 mg daily for prevention.     GAD (generalized anxiety disorder) Assessment & Plan: Controlled.  Continue Zoloft 50 mg daily.  Continue to monitor.   Menorrhagia with regular cycle Assessment & Plan: Controlled.  Follows with GYN. Continue NuvaRing  Pap smear due.   Seasonal allergies Assessment & Plan: Deteriorated.  Discussed use of Xyzal and saline nasal sprays.          Lindsey Nest, NP

## 2023-03-08 NOTE — Assessment & Plan Note (Signed)
Deteriorated.  Discussed use of Xyzal and saline nasal sprays.

## 2023-03-08 NOTE — Assessment & Plan Note (Signed)
Controlled.  Follows with GYN. Continue NuvaRing  Pap smear due.

## 2023-03-08 NOTE — Assessment & Plan Note (Signed)
Controlled.  Continue Zoloft 50 mg daily. Continue to monitor.  

## 2023-03-08 NOTE — Assessment & Plan Note (Addendum)
Controlled.  Continue propranolol ER 80 mg daily. Continue Maxalt 5 mg PRN for which she uses sparingly.  Continue Zofran 4 mg PRN.

## 2023-03-08 NOTE — Assessment & Plan Note (Signed)
Immunizations UTD. Pap smear due, she will have done at GYN office. Discussed the importance of a healthy diet and regular exercise in order for weight loss, and to reduce the risk of further co-morbidity.  Exam stable.  Follow up in 1 year for repeat physical.

## 2023-03-08 NOTE — Assessment & Plan Note (Signed)
Controlled.  Continue Propranolol ER 80 mg daily for prevention.

## 2023-04-28 ENCOUNTER — Other Ambulatory Visit: Payer: Self-pay | Admitting: Primary Care

## 2023-04-28 DIAGNOSIS — F411 Generalized anxiety disorder: Secondary | ICD-10-CM

## 2023-05-24 LAB — HM PAP SMEAR

## 2023-07-07 ENCOUNTER — Other Ambulatory Visit: Payer: Self-pay | Admitting: Primary Care

## 2023-07-07 DIAGNOSIS — G43009 Migraine without aura, not intractable, without status migrainosus: Secondary | ICD-10-CM

## 2023-07-18 ENCOUNTER — Ambulatory Visit (HOSPITAL_COMMUNITY): Payer: 59

## 2023-12-28 ENCOUNTER — Other Ambulatory Visit: Payer: Self-pay | Admitting: Primary Care

## 2023-12-28 DIAGNOSIS — F411 Generalized anxiety disorder: Secondary | ICD-10-CM

## 2023-12-28 DIAGNOSIS — R519 Headache, unspecified: Secondary | ICD-10-CM

## 2023-12-28 NOTE — Telephone Encounter (Signed)
 Called  pt and schedule a appt for cpe

## 2023-12-28 NOTE — Telephone Encounter (Signed)
Patient is due for CPE/follow up in late May, this will be required prior to any further refills.  Please schedule, thank you!   

## 2024-02-04 ENCOUNTER — Other Ambulatory Visit: Payer: Self-pay | Admitting: Primary Care

## 2024-02-04 DIAGNOSIS — F411 Generalized anxiety disorder: Secondary | ICD-10-CM

## 2024-03-08 ENCOUNTER — Ambulatory Visit (INDEPENDENT_AMBULATORY_CARE_PROVIDER_SITE_OTHER): Admitting: Primary Care

## 2024-03-08 ENCOUNTER — Encounter: Payer: Self-pay | Admitting: Primary Care

## 2024-03-08 VITALS — BP 118/72 | HR 74 | Temp 97.2°F | Ht 68.0 in | Wt 205.0 lb

## 2024-03-08 DIAGNOSIS — G43009 Migraine without aura, not intractable, without status migrainosus: Secondary | ICD-10-CM

## 2024-03-08 DIAGNOSIS — F411 Generalized anxiety disorder: Secondary | ICD-10-CM

## 2024-03-08 DIAGNOSIS — Z0000 Encounter for general adult medical examination without abnormal findings: Secondary | ICD-10-CM | POA: Diagnosis not present

## 2024-03-08 DIAGNOSIS — R519 Headache, unspecified: Secondary | ICD-10-CM

## 2024-03-08 NOTE — Assessment & Plan Note (Signed)
 Immunizations UTD. Pap smear UTD.  Follows with GYN  Discussed the importance of a healthy diet and regular exercise in order for weight loss, and to reduce the risk of further co-morbidity.  Exam stable. Labs pending.  Follow up in 1 year for repeat physical.

## 2024-03-08 NOTE — Assessment & Plan Note (Signed)
 Improved!  Continue propranolol  ER 80 mg daily for prevention. Continue Maxalt  5 mg PRN.

## 2024-03-08 NOTE — Patient Instructions (Signed)
 Good luck with NCLEX! You will do great!  It was a pleasure to see you today!

## 2024-03-08 NOTE — Progress Notes (Signed)
 Subjective:    Patient ID: Lindsey Schmidt, female    DOB: September 20, 2001, 23 y.o.   MRN: [id number]  HPI  Lindsey Schmidt is a very pleasant 23 y.o. female who presents today for complete physical and follow up of chronic conditions.  Immunizations: -Tetanus: Completed in 2023 -HPV vaccines: Completed series   Diet: Fair diet.  Exercise: No regular exercise.  Eye exam: Completes annually  Dental exam: Completes semi-annually    Pap Smear: Completed in 2024 per GYN   BP Readings from Last 3 Encounters:  03/08/24 118/72  03/08/23 110/64  10/06/22 138/84      Review of Systems  Constitutional:  Negative for unexpected weight change.  HENT:  Negative for rhinorrhea.   Respiratory:  Negative for cough and shortness of breath.   Cardiovascular:  Negative for chest pain.  Gastrointestinal:  Negative for constipation and diarrhea.  Genitourinary:  Negative for difficulty urinating and menstrual problem.  Musculoskeletal:  Negative for arthralgias and myalgias.  Skin:  Negative for rash.  Allergic/Immunologic: Negative for environmental allergies.  Neurological:  Negative for dizziness and headaches.  Psychiatric/Behavioral:  The patient is not nervous/anxious.          Past Medical History:  Diagnosis Date   Menorrhagia    Ovarian cyst    x2    Social History   Socioeconomic History   Marital status: Single    Spouse name: Not on file   Number of children: Not on file   Years of education: Not on file   Highest education level: Bachelor's degree (e.g., BA, AB, BS)  Occupational History   Not on file  Tobacco Use   Smoking status: Never   Smokeless tobacco: Never  Substance and Sexual Activity   Alcohol use: No   Drug use: Not on file   Sexual activity: Not on file  Other Topics Concern   Not on file  Social History Narrative   9th grade.   Enjoys History.   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   Enjoys playing golf, softball, plays in the marching band.    Social Drivers of Corporate investment banker Strain: Low Risk  (03/05/2024)   Overall Financial Resource Strain (CARDIA)    Difficulty of Paying Living Expenses: Not very hard  Food Insecurity: No Food Insecurity (03/05/2024)   Hunger Vital Sign    Worried About Running Out of Food in the Last Year: Never true    Ran Out of Food in the Last Year: Never true  Transportation Needs: No Transportation Needs (03/05/2024)   PRAPARE - Administrator, Civil Service (Medical): No    Lack of Transportation (Non-Medical): No  Physical Activity: Insufficiently Active (03/05/2024)   Exercise Vital Sign    Days of Exercise per Week: 3 days    Minutes of Exercise per Session: 30 min  Stress: No Stress Concern Present (03/05/2024)   [employer] of Occupational Health - Occupational Stress Questionnaire    Feeling of Stress : Only a little  Social Connections: Moderately Integrated (03/05/2024)   Social Connection and Isolation Panel [NHANES]    Frequency of Communication with Friends and Family: Three times a week    Frequency of Social Gatherings with Friends and Family: Three times a week    Attends Religious Services: More than 4 times per year    Active Member of Clubs or Organizations: Yes    Attends Banker Meetings: More than 4 times per year  Marital Status: Never married  Intimate Partner Violence: Not on file    Past Surgical History:  Procedure Laterality Date   TONSILLECTOMY      History reviewed. No pertinent family history.  No Known Allergies  Current Outpatient Medications on File Prior to Visit  Medication Sig Dispense Refill   etonogestrel-ethinyl estradiol (NUVARING) 0.12-0.015 MG/24HR vaginal ring SMARTSIG:1 Ring Vaginal Once a Month     propranolol  ER (INDERAL  LA) 80 MG 24 hr capsule TAKE 1 CAPSULE (80 MG TOTAL) BY MOUTH AT BEDTIME. FOR HEADACHE PREVENTION AND ANXIETY 90 capsule 0   rizatriptan  (MAXALT ) 5 MG tablet TAKE 1 TABLET BY MOUTH  AT MIGRAINE ONSET. MAY REPEAT IN 2 HOURS IF NEEDED 10 tablet 0   sertraline  (ZOLOFT ) 50 MG tablet TAKE 1 TABLET (50 MG TOTAL) BY MOUTH DAILY. FOR ANXIETY AND DEPRESSION. 90 tablet 0   ondansetron  (ZOFRAN ) 4 MG tablet Take 1 tablet (4 mg total) by mouth every 8 (eight) hours as needed for nausea or vomiting. (Patient not taking: Reported on 03/08/2024) 20 tablet 0   No current facility-administered medications on file prior to visit.    BP 118/72   Pulse 74   Temp (!) 97.2 F (36.2 C) (Temporal)   Ht 5\' 8"  (1.727 m)   Wt 205 lb (93 kg)   LMP 03/07/2024 (Exact Date)   SpO2 98%   BMI 31.17 kg/m  Objective:   Physical Exam HENT:     Right Ear: Tympanic membrane and ear canal normal.     Left Ear: Tympanic membrane and ear canal normal.  Eyes:     Pupils: Pupils are equal, round, and reactive to light.  Cardiovascular:     Rate and Rhythm: Normal rate and regular rhythm.  Pulmonary:     Effort: Pulmonary effort is normal.     Breath sounds: Normal breath sounds.  Abdominal:     General: Bowel sounds are normal.     Palpations: Abdomen is soft.     Tenderness: There is no abdominal tenderness.  Musculoskeletal:        General: Normal range of motion.     Cervical back: Neck supple.  Skin:    General: Skin is warm and dry.  Neurological:     Mental Status: She is alert and oriented to person, place, and time.     Cranial Nerves: No cranial nerve deficit.     Deep Tendon Reflexes:     Reflex Scores:      Patellar reflexes are 2+ on the right side and 2+ on the left side. Psychiatric:        Mood and Affect: Mood normal.           Assessment & Plan:  Preventative health care Assessment & Plan: Immunizations UTD. Pap smear UTD. Follows with GYN  Discussed the importance of a healthy diet and regular exercise in order for weight loss, and to reduce the risk of further co-morbidity.  Exam stable. Labs pending.  Follow up in 1 year for repeat  physical.    Migraine without aura and without status migrainosus, not intractable Assessment & Plan: Improved!  Continue propranolol  ER 80 mg daily for prevention. Continue Maxalt  5 mg PRN.    Frequent headaches Assessment & Plan: Improved!  Continue propranolol  ER 80 mg daily for prevention. Continue Maxalt  5 mg PRN.    GAD (generalized anxiety disorder) Assessment & Plan: Controlled!  We discussed potential weaning off of Zoloft  within the next several months.  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 Continue Zoloft  50 mg daily for now.         Ky Rumple K Mattelyn Imhoff, NP

## 2024-03-08 NOTE — Assessment & Plan Note (Signed)
 Controlled!  We discussed potential weaning off of Zoloft  within the next several months.  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 Continue Zoloft  50 mg daily for now.

## 2024-03-18 NOTE — Telephone Encounter (Signed)
 Kelli, will you place her form up front? She is picking it up on 06/02

## 2024-03-19 NOTE — Telephone Encounter (Signed)
 Form placed up front for pick up.

## 2024-04-18 ENCOUNTER — Other Ambulatory Visit: Payer: Self-pay | Admitting: Primary Care

## 2024-04-18 DIAGNOSIS — F411 Generalized anxiety disorder: Secondary | ICD-10-CM

## 2024-04-18 DIAGNOSIS — R519 Headache, unspecified: Secondary | ICD-10-CM

## 2024-04-19 ENCOUNTER — Other Ambulatory Visit: Payer: Self-pay | Admitting: Primary Care

## 2024-04-19 DIAGNOSIS — F411 Generalized anxiety disorder: Secondary | ICD-10-CM

## 2024-05-23 DIAGNOSIS — N920 Excessive and frequent menstruation with regular cycle: Secondary | ICD-10-CM

## 2024-05-24 MED ORDER — ETONOGESTREL-ETHINYL ESTRADIOL 0.12-0.015 MG/24HR VA RING
VAGINAL_RING | VAGINAL | 0 refills | Status: DC
Start: 2024-05-24 — End: 2024-07-30

## 2024-07-30 ENCOUNTER — Other Ambulatory Visit: Payer: Self-pay

## 2024-07-30 DIAGNOSIS — N920 Excessive and frequent menstruation with regular cycle: Secondary | ICD-10-CM

## 2024-07-30 MED ORDER — ETONOGESTREL-ETHINYL ESTRADIOL 0.12-0.015 MG/24HR VA RING
VAGINAL_RING | VAGINAL | 1 refills | Status: AC
Start: 2024-07-30 — End: ?
